# Patient Record
Sex: Female | Born: 1960 | Race: White | Hispanic: No | Smoking: Current some day smoker
Health system: Southern US, Community
[De-identification: ages and names within clinical notes are randomized; demographics above are authoritative.]

## PROBLEM LIST (undated history)

## (undated) DIAGNOSIS — K219 Gastro-esophageal reflux disease without esophagitis: Secondary | ICD-10-CM

## (undated) DIAGNOSIS — G4730 Sleep apnea, unspecified: Secondary | ICD-10-CM

## (undated) DIAGNOSIS — F419 Anxiety disorder, unspecified: Secondary | ICD-10-CM

## (undated) DIAGNOSIS — T7840XA Allergy, unspecified, initial encounter: Secondary | ICD-10-CM

## (undated) DIAGNOSIS — J349 Unspecified disorder of nose and nasal sinuses: Secondary | ICD-10-CM

## (undated) DIAGNOSIS — E119 Type 2 diabetes mellitus without complications: Secondary | ICD-10-CM

## (undated) DIAGNOSIS — D649 Anemia, unspecified: Secondary | ICD-10-CM

## (undated) DIAGNOSIS — F329 Major depressive disorder, single episode, unspecified: Secondary | ICD-10-CM

## (undated) DIAGNOSIS — F259 Schizoaffective disorder, unspecified: Secondary | ICD-10-CM

## (undated) DIAGNOSIS — F32A Depression, unspecified: Secondary | ICD-10-CM

## (undated) DIAGNOSIS — K589 Irritable bowel syndrome without diarrhea: Secondary | ICD-10-CM

## (undated) HISTORY — DX: Irritable bowel syndrome, unspecified: K58.9

## (undated) HISTORY — DX: Depression, unspecified: F32.A

## (undated) HISTORY — DX: Unspecified disorder of nose and nasal sinuses: J34.9

## (undated) HISTORY — PX: NASAL SINUS SURGERY: SHX719

## (undated) HISTORY — DX: Anemia, unspecified: D64.9

## (undated) HISTORY — DX: Major depressive disorder, single episode, unspecified: F32.9

## (undated) HISTORY — PX: OTHER SURGICAL HISTORY: SHX169

## (undated) HISTORY — DX: Allergy, unspecified, initial encounter: T78.40XA

## (undated) HISTORY — DX: Sleep apnea, unspecified: G47.30

## (undated) HISTORY — DX: Schizoaffective disorder, unspecified: F25.9

## (undated) HISTORY — DX: Anxiety disorder, unspecified: F41.9

---

## 1997-09-11 ENCOUNTER — Other Ambulatory Visit: Admission: RE | Admit: 1997-09-11 | Discharge: 1997-09-11 | Payer: Self-pay | Admitting: Obstetrics and Gynecology

## 1997-10-16 ENCOUNTER — Ambulatory Visit: Admission: RE | Admit: 1997-10-16 | Discharge: 1997-10-16 | Payer: Self-pay | Admitting: Otolaryngology

## 1999-06-14 ENCOUNTER — Encounter: Payer: Self-pay | Admitting: Family Medicine

## 1999-06-14 ENCOUNTER — Encounter: Admission: RE | Admit: 1999-06-14 | Discharge: 1999-06-14 | Payer: Self-pay | Admitting: Family Medicine

## 2000-06-20 ENCOUNTER — Encounter: Admission: RE | Admit: 2000-06-20 | Discharge: 2000-06-20 | Payer: Self-pay | Admitting: Obstetrics and Gynecology

## 2000-06-20 ENCOUNTER — Encounter: Payer: Self-pay | Admitting: Obstetrics and Gynecology

## 2000-08-02 ENCOUNTER — Other Ambulatory Visit: Admission: RE | Admit: 2000-08-02 | Discharge: 2000-08-02 | Payer: Self-pay | Admitting: Obstetrics and Gynecology

## 2001-07-12 ENCOUNTER — Encounter: Payer: Self-pay | Admitting: Obstetrics and Gynecology

## 2001-07-12 ENCOUNTER — Encounter: Admission: RE | Admit: 2001-07-12 | Discharge: 2001-07-12 | Payer: Self-pay | Admitting: Obstetrics and Gynecology

## 2001-10-02 ENCOUNTER — Other Ambulatory Visit: Admission: RE | Admit: 2001-10-02 | Discharge: 2001-10-02 | Payer: Self-pay | Admitting: Obstetrics and Gynecology

## 2002-07-15 ENCOUNTER — Encounter: Admission: RE | Admit: 2002-07-15 | Discharge: 2002-07-15 | Payer: Self-pay | Admitting: Obstetrics and Gynecology

## 2002-07-15 ENCOUNTER — Encounter: Payer: Self-pay | Admitting: Obstetrics and Gynecology

## 2003-07-24 ENCOUNTER — Encounter: Admission: RE | Admit: 2003-07-24 | Discharge: 2003-07-24 | Payer: Self-pay | Admitting: Obstetrics and Gynecology

## 2003-11-27 ENCOUNTER — Other Ambulatory Visit: Admission: RE | Admit: 2003-11-27 | Discharge: 2003-11-27 | Payer: Self-pay | Admitting: Obstetrics and Gynecology

## 2004-07-16 ENCOUNTER — Ambulatory Visit (HOSPITAL_COMMUNITY): Admission: RE | Admit: 2004-07-16 | Discharge: 2004-07-16 | Payer: Self-pay | Admitting: Obstetrics and Gynecology

## 2005-07-18 ENCOUNTER — Ambulatory Visit (HOSPITAL_COMMUNITY): Admission: RE | Admit: 2005-07-18 | Discharge: 2005-07-18 | Payer: Self-pay | Admitting: Obstetrics and Gynecology

## 2005-09-09 ENCOUNTER — Encounter (INDEPENDENT_AMBULATORY_CARE_PROVIDER_SITE_OTHER): Payer: Self-pay | Admitting: Specialist

## 2005-09-09 ENCOUNTER — Ambulatory Visit (HOSPITAL_COMMUNITY): Admission: RE | Admit: 2005-09-09 | Discharge: 2005-09-09 | Payer: Self-pay | Admitting: Obstetrics and Gynecology

## 2006-07-21 ENCOUNTER — Encounter: Admission: RE | Admit: 2006-07-21 | Discharge: 2006-07-21 | Payer: Self-pay | Admitting: Obstetrics and Gynecology

## 2007-07-24 ENCOUNTER — Ambulatory Visit (HOSPITAL_COMMUNITY): Admission: RE | Admit: 2007-07-24 | Discharge: 2007-07-24 | Payer: Self-pay | Admitting: Obstetrics and Gynecology

## 2008-08-07 ENCOUNTER — Ambulatory Visit (HOSPITAL_COMMUNITY): Admission: RE | Admit: 2008-08-07 | Discharge: 2008-08-07 | Payer: Self-pay | Admitting: Obstetrics and Gynecology

## 2009-08-10 ENCOUNTER — Ambulatory Visit (HOSPITAL_COMMUNITY): Admission: RE | Admit: 2009-08-10 | Discharge: 2009-08-10 | Payer: Self-pay | Admitting: Obstetrics and Gynecology

## 2010-06-15 ENCOUNTER — Other Ambulatory Visit (HOSPITAL_COMMUNITY): Payer: Self-pay | Admitting: Obstetrics and Gynecology

## 2010-06-15 DIAGNOSIS — Z1231 Encounter for screening mammogram for malignant neoplasm of breast: Secondary | ICD-10-CM

## 2010-07-23 NOTE — Op Note (Signed)
Diane Velasquez, Diane Velasquez               ACCOUNT NO.:  [id number]   MEDICAL RECORD NO.:  [id number]          PATIENT TYPE:  AMB   LOCATION:  SDC                           FACILITY:  [facility]   PHYSICIAN:  Miguel Aschoff, M.D.       DATE OF BIRTH:  30-May-1960   DATE OF PROCEDURE:  09/09/2005  DATE OF DISCHARGE:                                 OPERATIVE REPORT   PREOPERATIVE DIAGNOSIS:  Menorrhagia.   POSTOPERATIVE DIAGNOSIS:  Menorrhagia with endometrial polyps.   PROCEDURE:  Cervical dilatation, hysteroscopy, uterine curettage, Novasure  endometrial ablation.   SURGEON:  Dr. Miguel Aschoff.   ANESTHESIA:  General.   COMPLICATIONS:  None.   JUSTIFICATION:  The patient is a 50 year old white female who suffers  schizoaffective disorder who has been troubled by very heavy menses with  passage of large clots and episodes of bleeding through her clothing.  Because of the heavy cycles she has requested that a definitive procedure be  carried out in effort to control the bleeding and has given her informed  consent for endometrial ablation, hysteroscopy and D&C.  Risks, benefits  were discussed with the patient and her mother.  Informed consent was  obtained.   PROCEDURE:  The patient is taken to the operating room, placed in supine  position.  General anesthesia was administered without difficulty.  She was  then placed in dorsolithotomy position, prepped, draped in usual sterile  fashion.  Examination revealed normal external genitalia, normal Bartholin's  and Skene's glands, normal urethra.  The vaginal vault was without gross  lesion.  The cervix was without gross lesion.  The uterus appeared be normal  size and shape.  The adnexa revealed no masses.  At this point, speculum was  placed in the vaginal vault.  Anterior cervical lip was grasped with  tenaculum and then the endometrial cavity was sounded to 8.5 cm.  A cervical  length 4 cm then determined for cavity length 4.5 cm.  Once this was  done,  the endocervical canal was further dilated using serial Pratt dilators and  the diagnostic hysteroscope was advanced through the endocervix.  No  endocervical lesions were noted.  On entering the endometrial cavity. there  appeared a large amount of tissue.  In addition there appeared to be several  polyps present which were removed with polyp forceps without difficulty.  These were sent as separate specimen.  Following this, sharp vigorous  curettage was carried out yielding a moderate to large amount of normal-  appearing tissue.  Again this was sent as a specimen to pathology.  After  the curettage was completed, the Novasure endometrial ablation unit was  advanced through the cervix positioned and cavity width of 3.0 cm was then  determined.  Then at a power of 74 watts a treatment cycle of 1 minute and  40 seconds was carried out successfully.  After completion of the treatment  cycle, the Novasure ablation unit was removed in toto.  The hysteroscope was  then replaced into uterine cavity.  At this point it was clear that no  further polyps  were present and cavity had been completely treated with the  ablation unit.  This point the procedure was completed.  All instruments  were removed.  The patient was taken out of lithotomy position, brought to  recovery in satisfactory condition. Estimated blood loss was approximately  30 mL.   Plan is for the patient be discharged home.  Medications for home include  doxycycline 100 mg twice a day x3 days, Darvocet N 100 one every 4 to 6  hours as needed for pain.  The patient instructed to place nothing in vagina  and call for any problems such as fever, pain or heavy bleeding.  She will  be seen back in 4 weeks for follow-up examination.      Miguel Aschoff, M.D.  Electronically Signed     AR/MEDQ  D:  09/09/2005  T:  09/09/2005  Job:  [id number]

## 2010-08-12 ENCOUNTER — Ambulatory Visit (HOSPITAL_COMMUNITY): Payer: MEDICARE

## 2010-08-20 ENCOUNTER — Ambulatory Visit (HOSPITAL_COMMUNITY)
Admission: RE | Admit: 2010-08-20 | Discharge: 2010-08-20 | Disposition: A | Discharge status: Home / Self Care | Payer: Medicare Other | Source: Ambulatory Visit | Attending: Obstetrics and Gynecology | Admitting: Obstetrics and Gynecology

## 2010-08-20 DIAGNOSIS — Z1231 Encounter for screening mammogram for malignant neoplasm of breast: Secondary | ICD-10-CM | POA: Insufficient documentation

## 2010-09-28 ENCOUNTER — Other Ambulatory Visit: Payer: Self-pay | Admitting: Obstetrics and Gynecology

## 2011-07-26 ENCOUNTER — Other Ambulatory Visit (HOSPITAL_COMMUNITY): Payer: Self-pay | Admitting: Obstetrics and Gynecology

## 2011-07-26 DIAGNOSIS — Z1231 Encounter for screening mammogram for malignant neoplasm of breast: Secondary | ICD-10-CM

## 2011-08-22 ENCOUNTER — Ambulatory Visit (HOSPITAL_COMMUNITY)
Admission: RE | Admit: 2011-08-22 | Discharge: 2011-08-22 | Disposition: A | Discharge status: Home / Self Care | Payer: Medicare PPO | Source: Ambulatory Visit | Attending: Obstetrics and Gynecology | Admitting: Obstetrics and Gynecology

## 2011-08-22 DIAGNOSIS — Z1231 Encounter for screening mammogram for malignant neoplasm of breast: Secondary | ICD-10-CM | POA: Insufficient documentation

## 2011-08-24 ENCOUNTER — Other Ambulatory Visit: Payer: Self-pay | Admitting: Obstetrics and Gynecology

## 2011-08-24 DIAGNOSIS — R928 Other abnormal and inconclusive findings on diagnostic imaging of breast: Secondary | ICD-10-CM

## 2011-08-31 ENCOUNTER — Ambulatory Visit
Admission: RE | Admit: 2011-08-31 | Discharge: 2011-08-31 | Disposition: A | Discharge status: Home / Self Care | Payer: Medicare PPO | Source: Ambulatory Visit | Attending: Obstetrics and Gynecology | Admitting: Obstetrics and Gynecology

## 2011-08-31 ENCOUNTER — Other Ambulatory Visit: Payer: Medicare Other

## 2011-08-31 DIAGNOSIS — R928 Other abnormal and inconclusive findings on diagnostic imaging of breast: Secondary | ICD-10-CM

## 2012-07-18 ENCOUNTER — Other Ambulatory Visit: Payer: Self-pay

## 2012-07-18 DIAGNOSIS — Z1231 Encounter for screening mammogram for malignant neoplasm of breast: Secondary | ICD-10-CM

## 2012-08-31 ENCOUNTER — Ambulatory Visit
Admission: RE | Admit: 2012-08-31 | Discharge: 2012-08-31 | Disposition: A | Discharge status: Home / Self Care | Payer: Medicare PPO | Source: Ambulatory Visit

## 2012-08-31 DIAGNOSIS — Z1231 Encounter for screening mammogram for malignant neoplasm of breast: Secondary | ICD-10-CM

## 2013-05-28 ENCOUNTER — Encounter: Payer: Self-pay | Admitting: Podiatrist

## 2013-05-28 ENCOUNTER — Ambulatory Visit (INDEPENDENT_AMBULATORY_CARE_PROVIDER_SITE_OTHER): Payer: Medicare PPO | Admitting: Podiatrist

## 2013-05-28 VITALS — BP 120/72 | HR 85 | Resp 18

## 2013-05-28 DIAGNOSIS — L03039 Cellulitis of unspecified toe: Secondary | ICD-10-CM

## 2013-05-28 NOTE — Progress Notes (Signed)
   Subjective:    Patient ID: Diane Velasquez, female    DOB: [birth year], 53 y.o.   MRN: [id number]  HPI MOM STATES THAT THE BIG TOENAIL HAS A SPLIT IN THE NAIL AND HAS A DISCOLORATION ON THE NAIL    Review of Systems  Respiratory: Positive for apnea.   Skin:       CHANGE IN NAILS  All other systems reviewed and are negative.       Objective:   Physical Exam Neurovascular status is intact with palpable pedal pulses and neurological sensation intact. Patient's left hallux nail has an area of incurvation along the medial nail border and there appears to be in a break in this nail causing pain and discomfort. The patient does have a tendency to pick at her skin in this area specifically bothersome to her. No redness, no swelling, no signs of infection are present.       Assessment & Plan:  Paronychia  Plan:  Debrided the corner of the nail and smoothed.. This should resolve the issue.

## 2013-08-28 ENCOUNTER — Other Ambulatory Visit: Payer: Self-pay

## 2013-08-28 DIAGNOSIS — Z803 Family history of malignant neoplasm of breast: Secondary | ICD-10-CM

## 2013-08-28 DIAGNOSIS — Z1231 Encounter for screening mammogram for malignant neoplasm of breast: Secondary | ICD-10-CM

## 2013-09-03 ENCOUNTER — Ambulatory Visit
Admission: RE | Admit: 2013-09-03 | Discharge: 2013-09-03 | Disposition: A | Discharge status: Home / Self Care | Payer: Commercial Managed Care - HMO | Source: Ambulatory Visit

## 2013-09-03 DIAGNOSIS — Z1231 Encounter for screening mammogram for malignant neoplasm of breast: Secondary | ICD-10-CM

## 2013-09-03 DIAGNOSIS — Z803 Family history of malignant neoplasm of breast: Secondary | ICD-10-CM

## 2013-09-25 ENCOUNTER — Other Ambulatory Visit: Payer: Self-pay | Admitting: Obstetrics and Gynecology

## 2013-09-26 LAB — CYTOLOGY - PAP

## 2014-08-21 ENCOUNTER — Other Ambulatory Visit: Payer: Self-pay

## 2014-08-21 DIAGNOSIS — Z1231 Encounter for screening mammogram for malignant neoplasm of breast: Secondary | ICD-10-CM

## 2014-09-10 ENCOUNTER — Ambulatory Visit
Admission: RE | Admit: 2014-09-10 | Discharge: 2014-09-10 | Disposition: A | Discharge status: Home / Self Care | Payer: Medicare PPO | Source: Ambulatory Visit

## 2014-09-10 DIAGNOSIS — Z1231 Encounter for screening mammogram for malignant neoplasm of breast: Secondary | ICD-10-CM

## 2014-09-11 ENCOUNTER — Other Ambulatory Visit: Payer: Self-pay | Admitting: Obstetrics and Gynecology

## 2014-09-11 DIAGNOSIS — R928 Other abnormal and inconclusive findings on diagnostic imaging of breast: Secondary | ICD-10-CM

## 2014-09-17 ENCOUNTER — Other Ambulatory Visit: Payer: Medicare PPO

## 2014-09-26 ENCOUNTER — Ambulatory Visit
Admission: RE | Admit: 2014-09-26 | Discharge: 2014-09-26 | Disposition: A | Discharge status: Home / Self Care | Payer: Medicare PPO | Source: Ambulatory Visit | Attending: Obstetrics and Gynecology | Admitting: Obstetrics and Gynecology

## 2014-09-26 DIAGNOSIS — R928 Other abnormal and inconclusive findings on diagnostic imaging of breast: Secondary | ICD-10-CM

## 2015-02-18 DIAGNOSIS — K219 Gastro-esophageal reflux disease without esophagitis: Secondary | ICD-10-CM | POA: Insufficient documentation

## 2015-02-18 DIAGNOSIS — F209 Schizophrenia, unspecified: Secondary | ICD-10-CM | POA: Insufficient documentation

## 2015-02-18 DIAGNOSIS — G4730 Sleep apnea, unspecified: Secondary | ICD-10-CM | POA: Insufficient documentation

## 2015-08-06 ENCOUNTER — Other Ambulatory Visit: Payer: Self-pay | Admitting: Obstetrics

## 2015-08-06 DIAGNOSIS — Z1231 Encounter for screening mammogram for malignant neoplasm of breast: Secondary | ICD-10-CM

## 2015-09-17 ENCOUNTER — Ambulatory Visit
Admission: RE | Admit: 2015-09-17 | Discharge: 2015-09-17 | Disposition: A | Discharge status: Home / Self Care | Payer: Medicare PPO | Source: Ambulatory Visit | Attending: Obstetrics | Admitting: Obstetrics

## 2015-09-17 DIAGNOSIS — Z1231 Encounter for screening mammogram for malignant neoplasm of breast: Secondary | ICD-10-CM

## 2015-10-01 ENCOUNTER — Other Ambulatory Visit: Payer: Self-pay | Admitting: Obstetrics

## 2015-10-01 DIAGNOSIS — E119 Type 2 diabetes mellitus without complications: Secondary | ICD-10-CM | POA: Insufficient documentation

## 2015-10-02 LAB — CYTOLOGY - PAP

## 2016-06-14 IMAGING — MG MM DIAG BREAST TOMO UNI LEFT
4 series · 4 of 12 positions shown · non-contrast
Comparison: Previous exam(s).

CLINICAL DATA: Call back from screening for a possible
architectural distortion in the left breast

EXAM:
DIGITAL DIAGNOSTIC LEFT MAMMOGRAM WITH 3D TOMOSYNTHESIS AND CAD

[L CC]
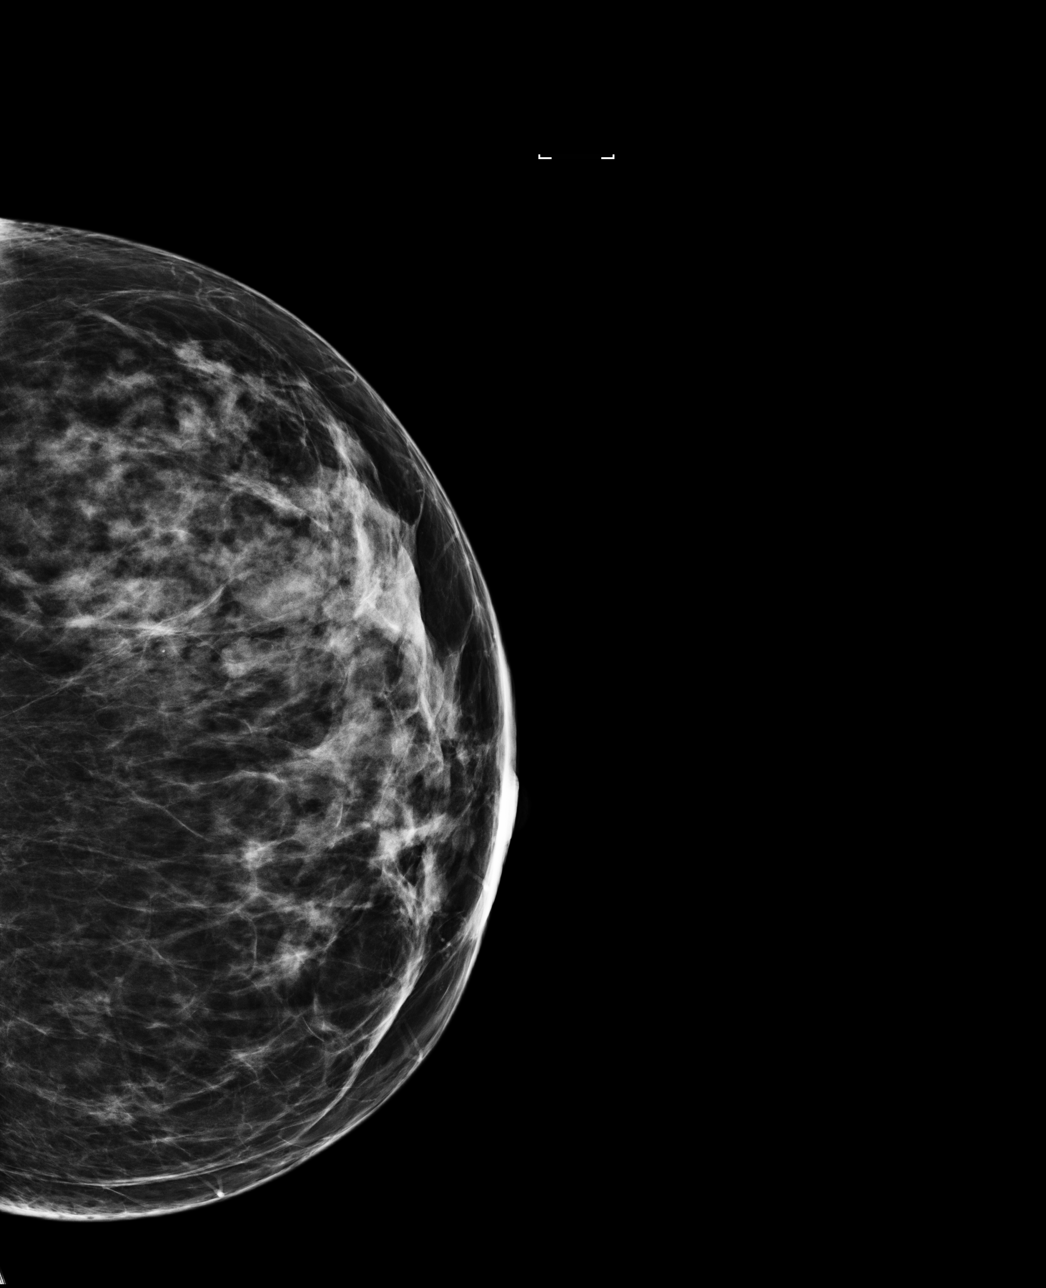

[L MLO]
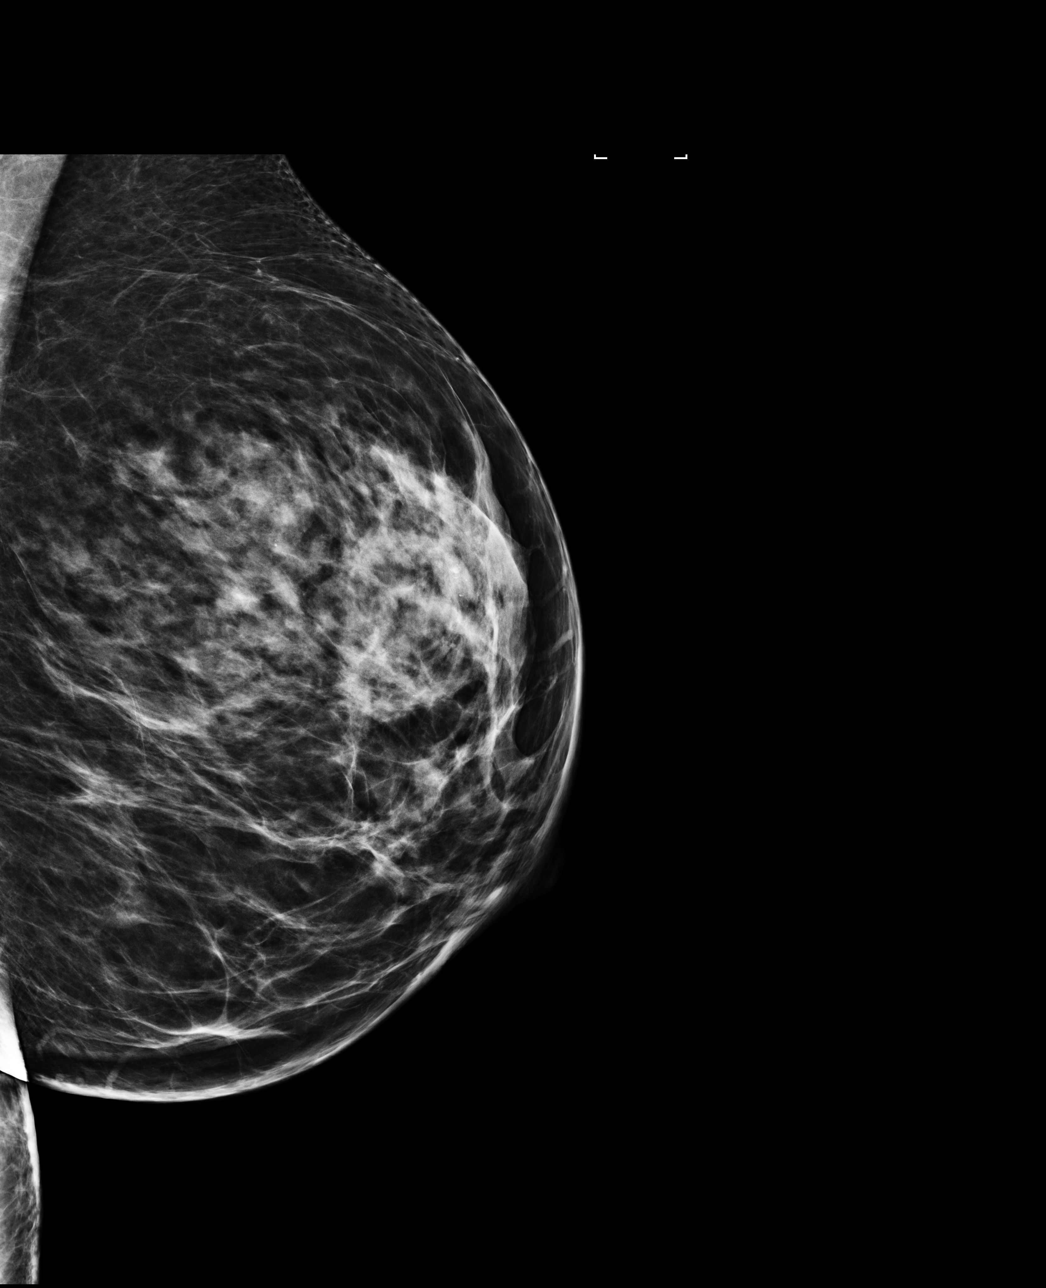

[L CC tomo · tomo slice 37/72.0]
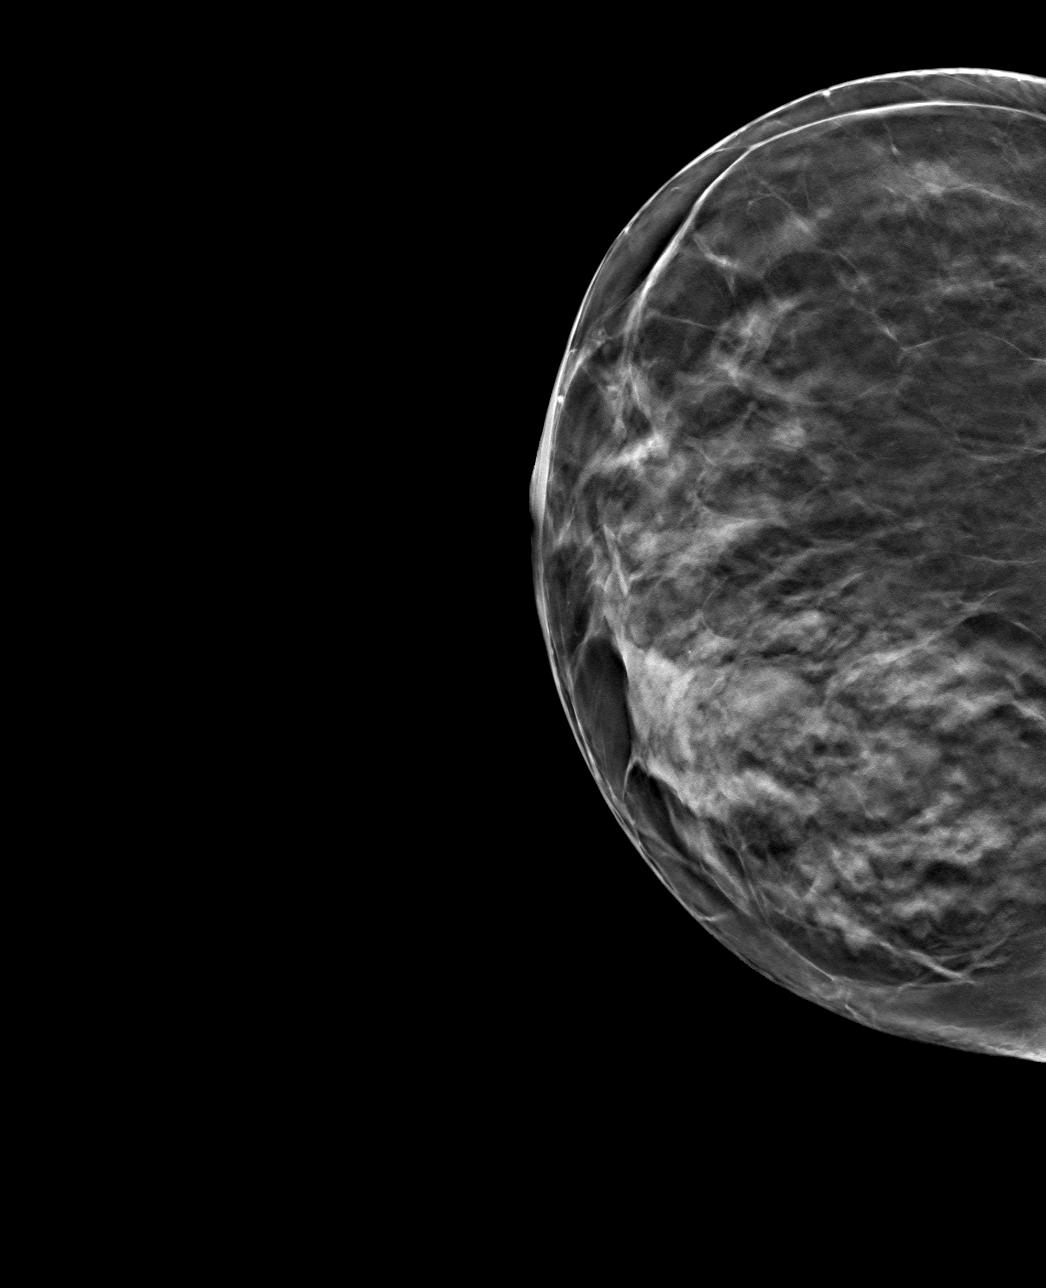

[L MLO tomo · tomo slice 40/79.0]
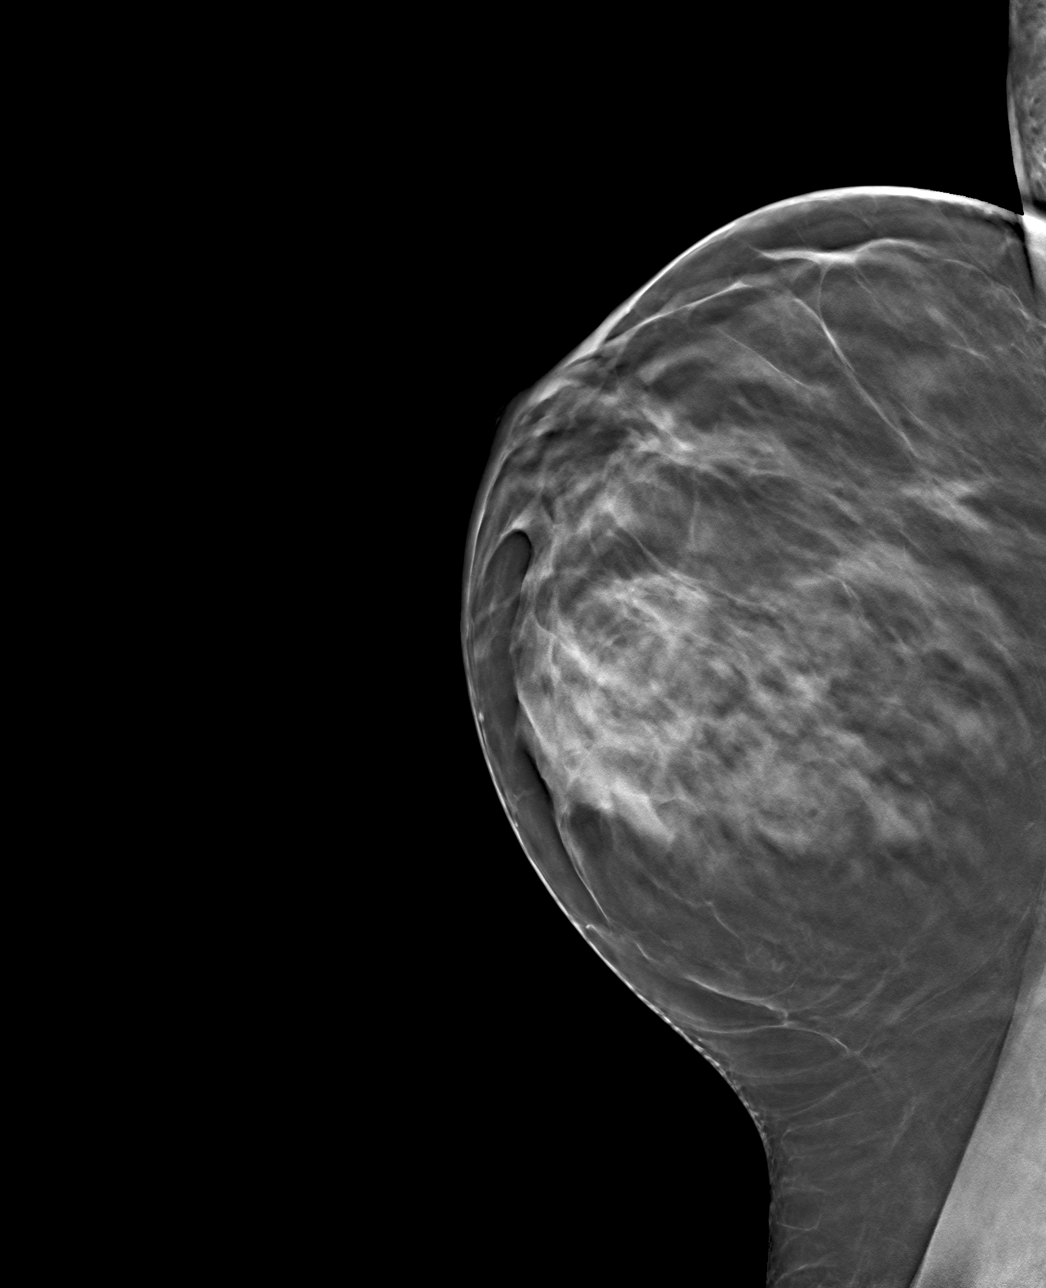

[4 of 12 positions shown; findings below may reference images not displayed]

ACR Breast Density Category c: The breast tissue is heterogeneously
dense, which may obscure small masses.
FINDINGS: On the diagnostic 2D and 3D tomosynthesis images, the area of
possible distortion appears as closely approximated normal
fibroglandular tissue without a true distortion. There is no
underlying mass. There has been no convincing mammographic change
over multiple years.

Mammographic images were processed with CAD.
IMPRESSION: No evidence of malignancy.

RECOMMENDATION:
Screening mammogram in one year.(Code:[id number])

I have discussed the findings and recommendations with the patient.
Results were also provided in writing at the conclusion of the
visit. If applicable, a reminder letter will be sent to the patient
regarding the next appointment.

BI-RADS CATEGORY  1: Negative.

## 2018-01-05 DIAGNOSIS — N6019 Diffuse cystic mastopathy of unspecified breast: Secondary | ICD-10-CM | POA: Diagnosis not present

## 2018-01-05 DIAGNOSIS — D649 Anemia, unspecified: Secondary | ICD-10-CM | POA: Diagnosis not present

## 2018-01-05 DIAGNOSIS — Z803 Family history of malignant neoplasm of breast: Secondary | ICD-10-CM | POA: Diagnosis not present

## 2018-01-05 DIAGNOSIS — Z79899 Other long term (current) drug therapy: Secondary | ICD-10-CM

## 2018-03-02 ENCOUNTER — Encounter (HOSPITAL_BASED_OUTPATIENT_CLINIC_OR_DEPARTMENT_OTHER): Payer: Self-pay

## 2018-03-05 ENCOUNTER — Other Ambulatory Visit: Payer: Self-pay

## 2018-03-05 ENCOUNTER — Encounter (HOSPITAL_BASED_OUTPATIENT_CLINIC_OR_DEPARTMENT_OTHER): Payer: Self-pay | Admitting: *Deleted

## 2018-03-05 NOTE — Progress Notes (Signed)
Spoke with patients mother (caregiver) via telephone for pre op interview. Mother will need to come back for pre op patient is schizophrenic. NPO after MN. Patient to take metoprolol with a sip of water AM of surgery. Patient had order for pre op ensure but patient or mother do not drive and were unable to come before DOS. Needs ISTAT 8 and T & S AM of surgery. Arrival time 0630.

## 2018-03-13 NOTE — Anesthesia Preprocedure Evaluation (Addendum)
Anesthesia Evaluation  Patient identified by MRN, date of birth, ID band Patient awake    Reviewed: Allergy & Precautions, NPO status , Patient's Chart, lab work & pertinent test results  Airway Mallampati: I  TM Distance: >3 FB Neck ROM: Full    Dental no notable dental hx. (+) Teeth Intact, Dental Advisory Given   Pulmonary sleep apnea and Continuous Positive Airway Pressure Ventilation ,    Pulmonary exam normal breath sounds clear to auscultation       Cardiovascular hypertension, Pt. on medications and Pt. on home beta blockers Normal cardiovascular exam Rhythm:Regular Rate:Normal     Neuro/Psych PSYCHIATRIC DISORDERS Anxiety Depression Schizophrenia negative neurological ROS     GI/Hepatic Neg liver ROS, GERD  Controlled,  Endo/Other  negative endocrine ROSdiabetes, Type 2, Oral Hypoglycemic Agents  Renal/GU negative Renal ROS  negative genitourinary   Musculoskeletal negative musculoskeletal ROS (+)   Abdominal   Peds  Hematology negative hematology ROS (+)   Anesthesia Other Findings CIN2  Reproductive/Obstetrics                            Anesthesia Physical Anesthesia Plan  ASA: III  Anesthesia Plan: General   Post-op Pain Management:    Induction: Intravenous  PONV Risk Score and Plan: 3 and Ondansetron and Dexamethasone  Airway Management Planned: LMA  Additional Equipment:   Intra-op Plan:   Post-operative Plan: Extubation in OR  Informed Consent: I have reviewed the patients History and Physical, chart, labs and discussed the procedure including the risks, benefits and alternatives for the proposed anesthesia with the patient or authorized representative who has indicated his/her understanding and acceptance.   Dental advisory given  Plan Discussed with: CRNA  Anesthesia Plan Comments:         Anesthesia Quick Evaluation

## 2018-03-14 ENCOUNTER — Ambulatory Visit (HOSPITAL_BASED_OUTPATIENT_CLINIC_OR_DEPARTMENT_OTHER): Payer: Medicare HMO | Admitting: Anesthesiology

## 2018-03-14 ENCOUNTER — Ambulatory Visit (HOSPITAL_COMMUNITY)
Admission: RE | Admit: 2018-03-14 | Discharge: 2018-03-14 | Disposition: A | Discharge status: Home / Self Care | Payer: Medicare HMO | Attending: Obstetrics | Admitting: Obstetrics

## 2018-03-14 ENCOUNTER — Encounter (HOSPITAL_BASED_OUTPATIENT_CLINIC_OR_DEPARTMENT_OTHER)
Admission: RE | Disposition: A | Discharge status: Home / Self Care | Payer: Self-pay | Source: Home / Self Care | Attending: Obstetrics

## 2018-03-14 ENCOUNTER — Encounter (HOSPITAL_BASED_OUTPATIENT_CLINIC_OR_DEPARTMENT_OTHER): Payer: Self-pay

## 2018-03-14 DIAGNOSIS — E119 Type 2 diabetes mellitus without complications: Secondary | ICD-10-CM | POA: Insufficient documentation

## 2018-03-14 DIAGNOSIS — N871 Moderate cervical dysplasia: Secondary | ICD-10-CM | POA: Diagnosis present

## 2018-03-14 DIAGNOSIS — F329 Major depressive disorder, single episode, unspecified: Secondary | ICD-10-CM | POA: Diagnosis not present

## 2018-03-14 DIAGNOSIS — G4730 Sleep apnea, unspecified: Secondary | ICD-10-CM | POA: Diagnosis not present

## 2018-03-14 DIAGNOSIS — F419 Anxiety disorder, unspecified: Secondary | ICD-10-CM | POA: Diagnosis not present

## 2018-03-14 DIAGNOSIS — Z79899 Other long term (current) drug therapy: Secondary | ICD-10-CM | POA: Diagnosis not present

## 2018-03-14 DIAGNOSIS — N870 Mild cervical dysplasia: Secondary | ICD-10-CM | POA: Diagnosis not present

## 2018-03-14 DIAGNOSIS — I10 Essential (primary) hypertension: Secondary | ICD-10-CM | POA: Insufficient documentation

## 2018-03-14 DIAGNOSIS — Z7984 Long term (current) use of oral hypoglycemic drugs: Secondary | ICD-10-CM | POA: Insufficient documentation

## 2018-03-14 HISTORY — PX: LEEP: SHX91

## 2018-03-14 HISTORY — DX: Gastro-esophageal reflux disease without esophagitis: K21.9

## 2018-03-14 HISTORY — DX: Type 2 diabetes mellitus without complications: E11.9

## 2018-03-14 LAB — POCT I-STAT, CHEM 8
BUN: 18 mg/dL (ref 6–20)
CALCIUM ION: 1.06 mmol/L — AB (ref 1.15–1.40)
Chloride: 110 mmol/L (ref 98–111)
Creatinine, Ser: 0.7 mg/dL (ref 0.44–1.00)
Glucose, Bld: 93 mg/dL (ref 70–99)
HCT: 29 % — ABNORMAL LOW (ref 36.0–46.0)
Hemoglobin: 9.9 g/dL — ABNORMAL LOW (ref 12.0–15.0)
Potassium: 4 mmol/L (ref 3.5–5.1)
Sodium: 140 mmol/L (ref 135–145)
TCO2: 25 mmol/L (ref 22–32)

## 2018-03-14 LAB — TYPE AND SCREEN
ABO/RH(D): O POS
Antibody Screen: NEGATIVE

## 2018-03-14 LAB — GLUCOSE, CAPILLARY: Glucose-Capillary: 122 mg/dL — ABNORMAL HIGH (ref 70–99)

## 2018-03-14 LAB — ABO/RH: ABO/RH(D): O POS

## 2018-03-14 SURGERY — LEEP (LOOP ELECTROSURGICAL EXCISION PROCEDURE)
Anesthesia: General | Site: Cervix

## 2018-03-14 MED ORDER — FENTANYL CITRATE (PF) 100 MCG/2ML IJ SOLN
INTRAMUSCULAR | Status: DC | PRN
  Administered 2018-03-14 (×2): 25 ug via INTRAVENOUS
  Administered 2018-03-14: 50 ug via INTRAVENOUS

## 2018-03-14 MED ORDER — MIDAZOLAM HCL 2 MG/2ML IJ SOLN
INTRAMUSCULAR | Status: AC
  Filled 2018-03-14: qty 2

## 2018-03-14 MED ORDER — GLYCOPYRROLATE PF 0.2 MG/ML IJ SOSY
PREFILLED_SYRINGE | INTRAMUSCULAR | Status: AC
  Filled 2018-03-14: qty 2

## 2018-03-14 MED ORDER — PROPOFOL 10 MG/ML IV BOLUS
INTRAVENOUS | Status: AC
  Filled 2018-03-14: qty 20

## 2018-03-14 MED ORDER — FENTANYL CITRATE (PF) 100 MCG/2ML IJ SOLN
25.0000 ug | INTRAMUSCULAR | Status: DC | PRN
  Administered 2018-03-14: 50 ug via INTRAVENOUS
  Filled 2018-03-14: qty 1

## 2018-03-14 MED ORDER — KETOROLAC TROMETHAMINE 30 MG/ML IJ SOLN
INTRAMUSCULAR | Status: DC | PRN
  Administered 2018-03-14: 30 mg via INTRAVENOUS

## 2018-03-14 MED ORDER — LIDOCAINE 2% (20 MG/ML) 5 ML SYRINGE
INTRAMUSCULAR | Status: AC
  Filled 2018-03-14: qty 5

## 2018-03-14 MED ORDER — GLYCOPYRROLATE PF 0.2 MG/ML IJ SOSY
PREFILLED_SYRINGE | INTRAMUSCULAR | Status: DC | PRN
  Administered 2018-03-14: 0.4 mg via INTRAVENOUS

## 2018-03-14 MED ORDER — PROPOFOL 10 MG/ML IV BOLUS
INTRAVENOUS | Status: DC | PRN
  Administered 2018-03-14: 130 mg via INTRAVENOUS

## 2018-03-14 MED ORDER — ATROPINE SULFATE 0.4 MG/ML IV SOSY
PREFILLED_SYRINGE | INTRAVENOUS | Status: DC | PRN
  Administered 2018-03-14: .4 mg via INTRAVENOUS

## 2018-03-14 MED ORDER — LIDOCAINE-EPINEPHRINE (PF) 1 %-1:200000 IJ SOLN
INTRAMUSCULAR | Status: DC | PRN
  Administered 2018-03-14: 14 mL

## 2018-03-14 MED ORDER — FENTANYL CITRATE (PF) 100 MCG/2ML IJ SOLN
INTRAMUSCULAR | Status: AC
  Filled 2018-03-14: qty 2

## 2018-03-14 MED ORDER — STERILE WATER FOR IRRIGATION IR SOLN
Status: DC | PRN
  Administered 2018-03-14: 1000 mL

## 2018-03-14 MED ORDER — MIDAZOLAM HCL 2 MG/2ML IJ SOLN
INTRAMUSCULAR | Status: DC | PRN
  Administered 2018-03-14: 1 mg via INTRAVENOUS

## 2018-03-14 MED ORDER — ONDANSETRON HCL 4 MG/2ML IJ SOLN
INTRAMUSCULAR | Status: DC | PRN
  Administered 2018-03-14: 4 mg via INTRAVENOUS

## 2018-03-14 MED ORDER — LACTATED RINGERS IV SOLN
INTRAVENOUS | Status: DC
  Administered 2018-03-14: 08:00:00 via INTRAVENOUS
  Filled 2018-03-14: qty 1000

## 2018-03-14 MED ORDER — DEXAMETHASONE SODIUM PHOSPHATE 10 MG/ML IJ SOLN
INTRAMUSCULAR | Status: DC | PRN
  Administered 2018-03-14: 10 mg via INTRAVENOUS

## 2018-03-14 MED ORDER — IODINE STRONG (LUGOLS) 5 % PO SOLN
ORAL | Status: DC | PRN
  Administered 2018-03-14: 3 mL

## 2018-03-14 MED ORDER — LIDOCAINE 2% (20 MG/ML) 5 ML SYRINGE
INTRAMUSCULAR | Status: DC | PRN
  Administered 2018-03-14: 80 mg via INTRAVENOUS

## 2018-03-14 SURGICAL SUPPLY — 26 items
APL SWBSTK 6 STRL LF DISP (MISCELLANEOUS) ×1
APPLICATOR COTTON TIP 6 STRL (MISCELLANEOUS) ×1 IMPLANT
APPLICATOR COTTON TIP 6IN STRL (MISCELLANEOUS) ×3
ELECT BALL LEEP 5MM RED (ELECTRODE) ×2 IMPLANT
ELECT LOOP LEEP RND 15X12 GRN (CUTTING LOOP)
ELECT LOOP LEEP RND 20X12 WHT (CUTTING LOOP) ×3
ELECT REM PT RETURN 9FT ADLT (ELECTROSURGICAL) ×3
ELECTRODE LOOP LP RND 15X12GRN (CUTTING LOOP) IMPLANT
ELECTRODE LOOP LP RND 20X12WHT (CUTTING LOOP) IMPLANT
ELECTRODE REM PT RTRN 9FT ADLT (ELECTROSURGICAL) ×1 IMPLANT
EXTENDER ELECT LOOP LEEP 10CM (CUTTING LOOP) IMPLANT
GLOVE BIO SURGEON STRL SZ7 (GLOVE) ×1 IMPLANT
GLOVE BIOGEL PI IND STRL 7.0 (GLOVE) ×1 IMPLANT
GLOVE BIOGEL PI INDICATOR 7.0 (GLOVE)
GOWN STRL REUS W/TWL LRG LVL3 (GOWN DISPOSABLE) ×2 IMPLANT
HIBICLENS CHG 4% 4OZ BTL (MISCELLANEOUS) ×1 IMPLANT
NS IRRIG 1000ML POUR BTL (IV SOLUTION) ×1 IMPLANT
PACK BASIN DAY SURGERY FS (CUSTOM PROCEDURE TRAY) ×2 IMPLANT
PACK VAGINAL MINOR WOMEN LF (CUSTOM PROCEDURE TRAY) ×1 IMPLANT
PAD OB MATERNITY 4.3X12.25 (PERSONAL CARE ITEMS) ×3 IMPLANT
PENCIL SMOKE EVAC W/HOLSTER (ELECTROSURGICAL) ×1 IMPLANT
SCOPETTES 8  STERILE (MISCELLANEOUS) ×6
SCOPETTES 8 STERILE (MISCELLANEOUS) ×2 IMPLANT
SUT ETHILON 3 0 PS 1 18 (SUTURE) IMPLANT
TOWEL OR 17X24 6PK STRL BLUE (TOWEL DISPOSABLE) ×2 IMPLANT
WATER STERILE IRR 1000ML POUR (IV SOLUTION) ×2 IMPLANT

## 2018-03-14 NOTE — Discharge Instructions (Signed)
Please anticipate abnormal discharge for 1-2 weeks following the procedure.  It may be dark yellow to brown with small clumps or clots.  Bleeding may be intermittent.  If you have a fever > 101F or heavy vaginal bleeding (soaking through a full pad more often than once an hour for 4 hours in a row), please call the office   Post Anesthesia Home Care Instructions  Activity: Get plenty of rest for the remainder of the day. A responsible individual must stay with you for 24 hours following the procedure.  For the next 24 hours, DO NOT: -Drive a car -Advertising copywriter -Drink alcoholic beverages -Take any medication unless instructed by your physician -Make any legal decisions or sign important papers.  Meals: Start with liquid foods such as gelatin or soup. Progress to regular foods as tolerated. Avoid greasy, spicy, heavy foods. If nausea and/or vomiting occur, drink only clear liquids until the nausea and/or vomiting subsides. Call your physician if vomiting continues.  Special Instructions/Symptoms: Your throat may feel dry or sore from the anesthesia or the breathing tube placed in your throat during surgery. If this causes discomfort, gargle with warm salt water. The discomfort should disappear within 24 hours.  If you had a scopolamine patch placed behind your ear for the management of post- operative nausea and/or vomiting:  1. The medication in the patch is effective for 72 hours, after which it should be removed.  Wrap patch in a tissue and discard in the trash. Wash hands thoroughly with soap and water. 2. You may remove the patch earlier than 72 hours if you experience unpleasant side effects which may include dry mouth, dizziness or visual disturbances. 3. Avoid touching the patch. Wash your hands with soap and water after contact with the patch.      Ibuprofen after 3 PM today if needed

## 2018-03-14 NOTE — Progress Notes (Signed)
Dr. Chestine Spore notified of patient's hemoglobin 29 and hematocrit 9.9. Okay per Dr. Chestine Spore.

## 2018-03-14 NOTE — H&P (Signed)
58 y.o. G0 presents for LEEP.  She was seen in the office for an annual exam.  A visible cervical lesion was noted and biopsied.  Pathology returned CIN 2.      She was unable to tolerate the subsequent placement of speculum at the time of scheduled LEEP, so presents today to undergo LEEP under anesthesia.    Past Medical History:  Diagnosis Date  . Allergy   . Anemia   . Anxiety   . Depression   . Diabetes mellitus without complication (HCC)   . GERD (gastroesophageal reflux disease)   . IBS (irritable bowel syndrome)   . Schizoaffective disorder (HCC)   . Sinus problem   . Sleep apnea     Past Surgical History:  Procedure Laterality Date  . hemoroidectomy    . NASAL SINUS SURGERY        Social History   Socioeconomic History  . Marital status: Widowed    Spouse name: Not on file  . Number of children: Not on file  . Years of education: Not on file  . Highest education level: Not on file  Tobacco Use  . Smoking status: Never Smoker  . Smokeless tobacco: Never Used  Substance and Sexual Activity  . Alcohol use: No  . Drug use: No  . Sexual activity: Not Currently   Patient has no known allergies.    Vitals:   03/14/18 0703  BP: (!) 145/84  Pulse: (!) 57  Resp: 18  Temp: 98.4 F (36.9 C)  SpO2: 97%     General:  NAD Abdomen:  soft     A/P   58 y.o.  G0  presents for LEEP for CIN 2   Discussed risks to include infection, bleeding, damage to surrounding structures (including but not limited to vagina, cervix, bladder, uterus), need for additional procedures.  All questions answered and patient elects to proceed.  Consent signed with patient and mother (legal guardian and medical power of attorney, appropriate paperwork reviewed)  [facility] GEFFEL Chestine Spore

## 2018-03-14 NOTE — Anesthesia Procedure Notes (Signed)
Procedure Name: LMA Insertion Date/Time: 03/14/2018 8:32 AM Performed by: Francie Massing, CRNA Pre-anesthesia Checklist: Patient identified, Emergency Drugs available, Suction available and Patient being monitored Patient Re-evaluated:Patient Re-evaluated prior to induction Oxygen Delivery Method: Circle system utilized Preoxygenation: Pre-oxygenation with 100% oxygen Induction Type: IV induction Ventilation: Mask ventilation without difficulty LMA: LMA inserted LMA Size: 4.0 Number of attempts: 1 Airway Equipment and Method: Bite block Placement Confirmation: positive ETCO2 Tube secured with: Tape Dental Injury: Teeth and Oropharynx as per pre-operative assessment

## 2018-03-14 NOTE — Transfer of Care (Signed)
Immediate Anesthesia Transfer of Care Note  Patient: Diane Velasquez  Procedure(s) Performed: Procedure(s) (LRB): LOOP ELECTROSURGICAL EXCISION PROCEDURE (LEEP) (N/A)  Patient Location: PACU  Anesthesia Type: General  Level of Consciousness: awake, oriented, sedated and patient cooperative  Airway & Oxygen Therapy: Patient Spontanous Breathing and Patient connected to face mask oxygen  Post-op Assessment: Report given to PACU RN and Post -op Vital signs reviewed and stable  Post vital signs: Reviewed and stable  Complications: No apparent anesthesia complications Last Vitals:  Vitals Value Taken Time  BP 132/84 03/14/2018  9:02 AM  Temp 36.3 C 03/14/2018  9:02 AM  Pulse 90 03/14/2018  9:08 AM  Resp 16 03/14/2018  9:08 AM  SpO2 97 % 03/14/2018  9:08 AM  Vitals shown include unvalidated device data.  Last Pain:  Vitals:   03/14/18 0902  TempSrc:   PainSc: 0-No pain      Patients Stated Pain Goal: 8 (03/14/18 0747)

## 2018-03-14 NOTE — Anesthesia Postprocedure Evaluation (Signed)
Anesthesia Post Note  Patient: Diane Velasquez  Procedure(s) Performed: LOOP ELECTROSURGICAL EXCISION PROCEDURE (LEEP) (N/A Cervix)     Patient location during evaluation: PACU Anesthesia Type: General Level of consciousness: awake and alert Pain management: pain level controlled Vital Signs Assessment: post-procedure vital signs reviewed and stable Respiratory status: spontaneous breathing, nonlabored ventilation, respiratory function stable and patient connected to nasal cannula oxygen Cardiovascular status: blood pressure returned to baseline and stable Postop Assessment: no apparent nausea or vomiting Anesthetic complications: no    Last Vitals:  Vitals:   03/14/18 0945 03/14/18 1100  BP: (!) 143/89 (!) 163/88  Pulse: 76 63  Resp: 12 14  Temp:  (!) 36.4 C  SpO2: 98% 100%    Last Pain:  Vitals:   03/14/18 1100  TempSrc:   PainSc: 3                  Greene Diodato L Halana Deisher

## 2018-03-14 NOTE — Op Note (Signed)
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  A grounding pad was placed on her skin. An insulated speculum was placed into the vagina and the smoke evacuation tubing was attached.  The previously noted cervical lesion was present, though somewhat smaller than noted during her annual exam when the biopsy was obtained.  The cervix and upper vagina were painted with Lugol's solution.  The cervix and upper vagina had a uniform uptake of lugol's. 15 mL of 1% lidocaine with epinephrine was injected in the cervix circumferentially.  The electrocautery was set to blend mode at 50 watts.   A LEEP electrode was used to perform an excision of the entire transformation zone.  A second pass was made to include an additional 0.5 cm of the upper aspect of the cervix. No visible lesion was seen remaining in there cervical canal. The specimen was sent to pathology.  An ECC was performed. The specimen was sent to pathology.  The base and the edges of the LEEP specimen were cauterized with a 5mm ball electrode. Hemostasis was achieved. Monsel's solution was placed on the LEEP bed.  All instruments were removed from the vagina. The grounding pad was taken off of the patient's skin.  The patient tolerated the procedure well.  She was monitored per protocol and discharged home after meeting criteria.

## 2018-03-16 ENCOUNTER — Encounter (HOSPITAL_BASED_OUTPATIENT_CLINIC_OR_DEPARTMENT_OTHER): Payer: Self-pay | Admitting: Obstetrics

## 2018-03-19 LAB — POCT I-STAT, CHEM 8
BUN: 23 mg/dL — ABNORMAL HIGH (ref 6–20)
CALCIUM ION: 1.15 mmol/L (ref 1.15–1.40)
Chloride: 111 mmol/L (ref 98–111)
Creatinine, Ser: 0.7 mg/dL (ref 0.44–1.00)
GLUCOSE: 90 mg/dL (ref 70–99)
HCT: 23 % — ABNORMAL LOW (ref 36.0–46.0)
Hemoglobin: 7.8 g/dL — ABNORMAL LOW (ref 12.0–15.0)
Potassium: 7 mmol/L (ref 3.5–5.1)
Sodium: 139 mmol/L (ref 135–145)
TCO2: 25 mmol/L (ref 22–32)

## 2019-02-13 ENCOUNTER — Encounter: Payer: Self-pay | Admitting: Neurology

## 2019-04-22 ENCOUNTER — Other Ambulatory Visit: Payer: Self-pay

## 2019-04-22 ENCOUNTER — Encounter: Payer: Self-pay | Admitting: Neurology

## 2019-04-22 ENCOUNTER — Ambulatory Visit (INDEPENDENT_AMBULATORY_CARE_PROVIDER_SITE_OTHER): Payer: Medicare HMO | Admitting: Neurology

## 2019-04-22 VITALS — BP 123/75 | HR 69 | Ht 69.0 in | Wt 201.8 lb

## 2019-04-22 DIAGNOSIS — N898 Other specified noninflammatory disorders of vagina: Secondary | ICD-10-CM | POA: Insufficient documentation

## 2019-04-22 DIAGNOSIS — R4189 Other symptoms and signs involving cognitive functions and awareness: Secondary | ICD-10-CM | POA: Diagnosis not present

## 2019-04-22 DIAGNOSIS — R4689 Other symptoms and signs involving appearance and behavior: Secondary | ICD-10-CM

## 2019-04-22 NOTE — Patient Instructions (Signed)
Good meeting you and your family! The minor white matter changes seen on the brain scan is nothing to worry about and would not cause any symptoms. These changes are due to hardening of the small blood vessels in the brain that are seen in people with diabetes, blood pressure, or cholesterol issues. It is very important to control these conditions. Continue follow-up with [facility] for your current symptoms. Follow-up as needed, call for any changes.

## 2019-04-22 NOTE — Progress Notes (Signed)
NEUROLOGY CONSULTATION NOTE  Diane Velasquez MRN: [id number] DOB: 12-02-1960  Referring provider: Joellyn Quails, NP Primary care provider: Dr. Charisse Klinefelter  Reason for consult:  Minor white matter microvascular ischemic on CT   Thank you for your kind referral of Diane Velasquez for consultation of the above symptoms. Although her history is well known to you, please allow me to reiterate it for the purpose of our medical record. The patient was accompanied to the clinic by her mother and brother (on virtual video conference) who also provides collateral information. Records and images were personally reviewed where available.   HISTORY OF PRESENT ILLNESS: This is a 59 year old right-handed woman with a history of hypertension, hyperlipidemia, diabetes, schizophrenia, mild intellectual disability/learning disability, presenting for evaluation of recent CT head report done for decline in cognitive function. Records from the [place] clinic at [facility] in 2016 indicate that she does not have a history of developmental delay but did attend special education classes in school. She was successful at her job at a [employer] for 12 years prior to her marriage in 1989, her behavior abruptly changed after the break up of the marriage in 1991 and that is when she was diagnosed with schizophrenia. She underwent chromosomal microarray testing which was normal. It was noted that it is possible she had an experience that was traumatic for her and may be suffering from PTSD. She feels her memory is okay. She is slow to answer questions and needs repeated questions. She lives with her mother, who she has been living with all her life except for the 2 years she was married. Her brother Diane Velasquez lives 88 miles away. Her mother administers her medications. She cooks and denies leaving the stove on. She denies misplacing things frequently. Her mother states that when Diane Velasquez, they  thought she should have brain imaging due to her change in behavior. Her mother feels things have been much worse after her father passed away in Jun 28, 2017. She also had a change in psychiatric medication, she had been on Clozaril since age 26, church friends kept telling her mother that she is overdosed so her mother started decreasing medication around 5-10 years ago, she was well on it with somee behavioral changes, then she was switched to Prozac and prn Valium. Her mother states that in the 30 years of taking care of her, she has not had nearly as much trouble taking care of her since she switched to Prozac and stopped Clorazil. Her mother reports that Risperdal 37m 1/2 tab BID was added 6 months ago, but she cannot say it is doing anything for her. She cannot stay in bed at night longer than 2-3 hours. She states she cannot get Diane Velasquez listen to her, for the past 2-3 years she would go to the kitchen and let the pan get too hot. She waits until her mother goes to sleep then would be "gorging herself, absolutely no control on what she eats." She states "Diane Velasquez not like to be shown what to do." Diane Velasquez noted to roll her eyes when this is mentioned. When asked about hallucinations, Diane Velasquez "I don't hear them anymore," then when asked what she used to hear, she states "not anything." Her mother reports that on occasion she asks her mother to go to the store, but otherwise she does not make conversation except to say what she wants, "she is pretty much like that always,  sort of a quiet girl."   She occasionally gets lightheaded and has had some falls. Her mother reports her left eye is really bad due to a cataract. She denies any headaches, diplopia, dysarthria/dysphagia, neck/back pain, focal numbness/tingling/weakness. She has constipation, no incontinence.   She had a head CT without contrast done 01/24/2019 which did not show any acute change. There were minor white matter microvascular changes  throughout the cerebral white matter bilaterally.   PAST MEDICAL HISTORY: Past Medical History:  Diagnosis Date  . Allergy   . Anemia   . Anxiety   . Depression   . Diabetes mellitus without complication (HCC)   . GERD (gastroesophageal reflux disease)   . IBS (irritable bowel syndrome)   . Schizoaffective disorder (HCC)   . Sinus problem   . Sleep apnea    uses daily    PAST SURGICAL HISTORY: Past Surgical History:  Procedure Laterality Date  . hemoroidectomy    . LEEP N/A 03/14/2018   Procedure: LOOP ELECTROSURGICAL EXCISION PROCEDURE (LEEP);  Surgeon: Clark, Dyanna, MD;  Location: [facility];  Service: Gynecology;  Laterality: N/A;  . NASAL SINUS SURGERY      MEDICATIONS: Current Outpatient Medications on File Prior to Visit  Medication Sig Dispense Refill  . atorvastatin (LIPITOR) 40 MG tablet Take 40 mg by mouth daily.    . diazepam (VALIUM) 5 MG tablet     . ferrous sulfate 325 (65 FE) MG EC tablet Take 325 mg by mouth 3 (three) times daily with meals.    . FLUoxetine (PROZAC) 20 MG capsule     . lisinopril (PRINIVIL,ZESTRIL) 10 MG tablet Take 10 mg by mouth daily.    . metFORMIN (GLUCOPHAGE) 500 MG tablet Take 500 mg by mouth 2 (two) times daily with a meal.    . metoprolol tartrate (LOPRESSOR) 25 MG tablet Take 25 mg by mouth 2 (two) times daily.    . spironolactone (ALDACTONE) 25 MG tablet Take 25 mg by mouth daily.     No current facility-administered medications on file prior to visit.    ALLERGIES: No Known Allergies  FAMILY HISTORY: History reviewed. No pertinent family history.  SOCIAL HISTORY: Social History   Socioeconomic History  . Marital status: Widowed    Spouse name: Not on file  . Number of children: Not on file  . Years of education: Not on file  . Highest education level: Not on file  Occupational History  . Not on file  Tobacco Use  . Smoking status: Current Some Day Smoker  . Smokeless tobacco: Never Used    Substance and Sexual Activity  . Alcohol use: Yes  . Drug use: No  . Sexual activity: Not Currently  Other Topics Concern  . Not on file  Social History Narrative  . Not on file   Social Determinants of Health   Financial Resource Strain:   . Difficulty of Paying Living Expenses: Not on file  Food Insecurity:   . Worried About Running Out of Food in the Last Year: Not on file  . Ran Out of Food in the Last Year: Not on file  Transportation Needs:   . Lack of Transportation (Medical): Not on file  . Lack of Transportation (Non-Medical): Not on file  Physical Activity:   . Days of Exercise per Week: Not on file  . Minutes of Exercise per Session: Not on file  Stress:   . Feeling of Stress : Not on file  Social Connections:   .   Frequency of Communication with Friends and Family: Not on file  . Frequency of Social Gatherings with Friends and Family: Not on file  . Attends Religious Services: Not on file  . Active Member of Clubs or Organizations: Not on file  . Attends Archivist Meetings: Not on file  . Marital Status: Not on file  Intimate Partner Violence:   . Fear of Current or Ex-Partner: Not on file  . Emotionally Abused: Not on file  . Physically Abused: Not on file  . Sexually Abused: Not on file    REVIEW OF SYSTEMS: Constitutional: No fevers, chills, or sweats, no generalized fatigue, change in appetite Eyes: No visual changes, double vision, eye pain Ear, nose and throat: No hearing loss, ear pain, nasal congestion, sore throat Cardiovascular: No chest pain, palpitations Respiratory:  No shortness of breath at rest or with exertion, wheezes GastrointestinaI: No nausea, vomiting, diarrhea, abdominal pain, fecal incontinence Genitourinary:  No dysuria, urinary retention or frequency Musculoskeletal:  No neck pain, back pain Integumentary: No rash, pruritus, skin lesions Neurological: as above Psychiatric: No depression, insomnia, anxiety Endocrine:  No palpitations, fatigue, diaphoresis, mood swings, change in appetite, change in weight, increased thirst Hematologic/Lymphatic:  No anemia, purpura, petechiae. Allergic/Immunologic: no itchy/runny eyes, nasal congestion, recent allergic reactions, rashes  PHYSICAL EXAM: Vitals:   04/22/19 0848  BP: 123/75  Pulse: 69  SpO2: 98%   General: No acute distress, flat affect, poor eye contact Head:  Normocephalic/atraumatic Skin/Extremities: No rash, no edema Neurological Exam: Mental status: alert and oriented to person, place, and time, no dysarthria or aphasia, Fund of knowledge is reduced. She is slow to respond to questions, needs repeated questioning. Recent and remote memory are impaired. Attention and concentration are reduced. SLUMS score 10/30  [employer] Mental Exam 04/22/2019  Weekday Correct 0  Current year 1  What state are we in? 1  Amount spent 1  Amount left 2  # of Animals 1  5 objects recall 0  Number series 0  Hour markers 1  Time correct 0  Placed X in triangle correctly 0  Largest Figure 1  Name of female 2  Date back to work 0  Type of work 0  State she lived in 0  Total score 10    Cranial nerves: CN I: not tested CN II: pupils equal, round and reactive to light, visual fields intact CN III, IV, VI:  full range of motion, no nystagmus, no ptosis CN V: facial sensation intact CN VII: upper and lower face symmetric CN VIII: hearing intact to conversation Bulk & Tone: normal, no fasciculations. Motor: 5/5 throughout with no pronator drift. Sensation: intact to light touch, cold, pin, vibration and joint position sense.  No extinction to double simultaneous stimulation.  Romberg test negative Deep Tendon Reflexes: +2 throughout, no ankle clonus Plantar responses: downgoing bilaterally Cerebellar: no incoordination on finger to nose testing Gait: slow and cautious, hunched posture, no ataxia Tremor: none  IMPRESSION: This is a 59 year old  right-handed woman with a history of hypertension, hyperlipidemia, diabetes, schizophrenia, mild intellectual disability/learning disability, presenting for evaluation of recent CT head report done for decline in cognitive function. Her SLUMS score today is 10/30. I discussed the head CT report with the patient and her family, there were minor chronic microvascular changes that are typical in patients with vascular risk factors such as HTN, DM. Cognitive changes appear to have become more concerning when she switched to Prozac from [place], her mother and  brother were advised to speak with her Psychiatrist regarding these concerns. She appears to have baseline intellectual disability that is worsened by underlying psychiatric condition. All her family's questions were answered, they were reassured about head CT, follow-up prn.   Thank you for allowing me to participate in the care of this patient. Please do not hesitate to call for any questions or concerns.    , M.D.  CC: Dr. Hooper, Charity Cranford, NP   

## 2019-05-06 ENCOUNTER — Encounter: Payer: Self-pay | Admitting: Neurology

## 2020-03-07 DIAGNOSIS — I10 Essential (primary) hypertension: Secondary | ICD-10-CM | POA: Insufficient documentation

## 2020-06-01 ENCOUNTER — Encounter: Payer: Self-pay | Admitting: Neurology

## 2020-06-02 DIAGNOSIS — E1165 Type 2 diabetes mellitus with hyperglycemia: Secondary | ICD-10-CM | POA: Insufficient documentation

## 2020-06-09 ENCOUNTER — Ambulatory Visit: Payer: Medicare Other | Admitting: Sports Medicine

## 2020-06-09 ENCOUNTER — Encounter: Payer: Self-pay | Admitting: Sports Medicine

## 2020-06-09 ENCOUNTER — Other Ambulatory Visit: Payer: Self-pay

## 2020-06-09 DIAGNOSIS — E1165 Type 2 diabetes mellitus with hyperglycemia: Secondary | ICD-10-CM

## 2020-06-09 DIAGNOSIS — M79675 Pain in left toe(s): Secondary | ICD-10-CM | POA: Diagnosis not present

## 2020-06-09 DIAGNOSIS — B351 Tinea unguium: Secondary | ICD-10-CM | POA: Diagnosis not present

## 2020-06-09 DIAGNOSIS — M79674 Pain in right toe(s): Secondary | ICD-10-CM

## 2020-06-09 MED ORDER — CICLOPIROX 8 % EX SOLN: Freq: Every day | CUTANEOUS | 0 refills | Status: AC

## 2020-06-09 NOTE — Progress Notes (Signed)
Subjective: Diane Velasquez is a 60 y.o. female patient with history of diabetes who presents to office today with assistance from legal guardian who complains of thickness to both big toes.  Guardian reports that they have not tried any type of treatment for the nail issue but she tries to do a good job of keeping the nails trimmed and file with the big toes are so thick that it is hard to do so.  Fasting blood sugar not recorded.  Now diet controlled diabetic  Patient Active Problem List   Diagnosis Date Noted  . Type 2 diabetes mellitus with hyperglycemia (HCC) 06/02/2020  . Essential hypertension 03/07/2020  . Vaginal discharge 04/22/2019  . Diabetes mellitus (HCC) 10/01/2015  . GERD (gastroesophageal reflux disease) 02/18/2015  . Schizophrenia (HCC) 02/18/2015  . Sleep apnea 02/18/2015   Current Outpatient Medications on File Prior to Visit  Medication Sig Dispense Refill  . ARIPiprazole (ABILIFY) 20 MG tablet Take 20 mg by mouth at bedtime as needed.    Marland Kitchen atorvastatin (LIPITOR) 40 MG tablet Take 40 mg by mouth daily.    . diazepam (VALIUM) 5 MG tablet     . ferrous sulfate 325 (65 FE) MG EC tablet Take 325 mg by mouth 3 (three) times daily with meals.    Marland Kitchen FLUoxetine (PROZAC) 20 MG capsule     . hydrOXYzine (ATARAX/VISTARIL) 10 MG tablet Take 10 mg by mouth 2 (two) times daily as needed.    Marland Kitchen lisinopril (PRINIVIL,ZESTRIL) 10 MG tablet Take 10 mg by mouth daily.    . metFORMIN (GLUCOPHAGE) 500 MG tablet Take 500 mg by mouth 2 (two) times daily with a meal.    . metoprolol succinate (TOPROL-XL) 25 MG 24 hr tablet Take 1 tablet by mouth daily.    . metoprolol tartrate (LOPRESSOR) 25 MG tablet Take 25 mg by mouth 2 (two) times daily.    Marland Kitchen spironolactone (ALDACTONE) 25 MG tablet Take 25 mg by mouth daily.    . traZODone (DESYREL) 50 MG tablet Take 50 mg by mouth at bedtime.     No current facility-administered medications on file prior to visit.   No Known Allergies  No results  found for this or any previous visit (from the past 2160 hour(s)).  Objective: General: Patient is awake, alert, and oriented x 2 and in no acute distress.  Integument: Skin is warm, dry and supple bilateral.  Bilateral hallux nails are tender, long, thickened and dystrophic with subungual debris, consistent with onychomycosis.  All other nails are within normal limits.  No signs of infection. No open lesions or preulcerative lesions present bilateral. Remaining integument unremarkable.  Vasculature:  Dorsalis Pedis pulse 2/4 bilateral. Posterior Tibial pulse 1/4 bilateral. Capillary fill time <3 sec 1-5 bilateral.  Neurology: Gross sensation present via light touch bilateral.  Musculoskeletal: No symptomatic pedal deformities noted bilateral. Muscular strength 5/5 in all lower extremity muscular groups bilateral without pain on range of motion . No tenderness with calf compression bilateral.  Assessment and Plan: Problem List Items Addressed This Visit      Endocrine   Type 2 diabetes mellitus with hyperglycemia (HCC)    Other Visit Diagnoses    Pain due to onychomycosis of toenails of both feet    -  Primary   Relevant Medications   ciclopirox (PENLAC) 8 % solution      -Examined patient. -Discussed and educated patient on diabetic foot care, especially with  regards to the vascular, neurological and musculoskeletal systems.  -  Stressed the importance of good glycemic control and the detriment of not  controlling glucose levels in relation to the foot. -Mechanically debrided bilateral hallux nails using sterile nail nipper and filed with dremel without incident  -Prescribed Penlac solution for patient to use as instructed -Recommend foot miracle for dry skin -Answered all patient questions -Patient to return as needed -Patient advised to call the office if any problems or questions arise in the meantime.  Asencion Islam, DPM

## 2021-02-24 ENCOUNTER — Ambulatory Visit: Payer: No Typology Code available for payment source | Admitting: Neurology

## 2022-01-03 DIAGNOSIS — I517 Cardiomegaly: Secondary | ICD-10-CM | POA: Diagnosis not present

## 2023-05-29 ENCOUNTER — Ambulatory Visit: Admitting: Podiatry

## 2023-06-05 ENCOUNTER — Ambulatory Visit: Admitting: Podiatry
